# Patient Record
Sex: Male | Born: 1990 | Race: White | Hispanic: No | Marital: Single
Health system: Southern US, Community
[De-identification: ages and names within clinical notes are randomized; demographics above are authoritative.]

---
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
## 2018-02-18 ENCOUNTER — Emergency Department
Admission: EM | Admit: 2018-02-18 | Discharge: 2018-02-18 | Disposition: A | Discharge status: Home / Self Care | Payer: Self-pay | Attending: Emergency Medicine | Admitting: Emergency Medicine

## 2018-02-18 ENCOUNTER — Encounter: Payer: Self-pay | Admitting: Emergency Medicine

## 2018-02-18 ENCOUNTER — Emergency Department: Payer: Self-pay

## 2018-02-18 DIAGNOSIS — Y939 Activity, unspecified: Secondary | ICD-10-CM | POA: Insufficient documentation

## 2018-02-18 DIAGNOSIS — W01198A Fall on same level from slipping, tripping and stumbling with subsequent striking against other object, initial encounter: Secondary | ICD-10-CM | POA: Insufficient documentation

## 2018-02-18 DIAGNOSIS — S2242XA Multiple fractures of ribs, left side, initial encounter for closed fracture: Secondary | ICD-10-CM | POA: Insufficient documentation

## 2018-02-18 DIAGNOSIS — Y929 Unspecified place or not applicable: Secondary | ICD-10-CM | POA: Insufficient documentation

## 2018-02-18 DIAGNOSIS — Y999 Unspecified external cause status: Secondary | ICD-10-CM | POA: Insufficient documentation

## 2018-02-18 DIAGNOSIS — W19XXXA Unspecified fall, initial encounter: Secondary | ICD-10-CM

## 2018-02-18 LAB — CBC
HCT: 49.1 % (ref 40.0–52.0)
Hemoglobin: 17.5 g/dL (ref 13.0–18.0)
MCH: 37.2 pg — AB (ref 26.0–34.0)
MCHC: 35.7 g/dL (ref 32.0–36.0)
MCV: 104.3 fL — AB (ref 80.0–100.0)
PLATELETS: 343 10*3/uL (ref 150–440)
RBC: 4.71 MIL/uL (ref 4.40–5.90)
RDW: 13.9 % (ref 11.5–14.5)
WBC: 8.8 10*3/uL (ref 3.8–10.6)

## 2018-02-18 LAB — COMPREHENSIVE METABOLIC PANEL
ALK PHOS: 92 U/L (ref 38–126)
ALT: 77 U/L — AB (ref 0–44)
ANION GAP: 9 (ref 5–15)
AST: 58 U/L — ABNORMAL HIGH (ref 15–41)
Albumin: 4.4 g/dL (ref 3.5–5.0)
BUN: 15 mg/dL (ref 6–20)
CALCIUM: 9.1 mg/dL (ref 8.9–10.3)
CHLORIDE: 103 mmol/L (ref 98–111)
CO2: 26 mmol/L (ref 22–32)
CREATININE: 0.98 mg/dL (ref 0.61–1.24)
Glucose, Bld: 111 mg/dL — ABNORMAL HIGH (ref 70–99)
Potassium: 4.2 mmol/L (ref 3.5–5.1)
SODIUM: 138 mmol/L (ref 135–145)
Total Bilirubin: 0.7 mg/dL (ref 0.3–1.2)
Total Protein: 7.7 g/dL (ref 6.5–8.1)

## 2018-02-18 LAB — URINALYSIS, COMPLETE (UACMP) WITH MICROSCOPIC
BILIRUBIN URINE: NEGATIVE
Bacteria, UA: NONE SEEN
GLUCOSE, UA: NEGATIVE mg/dL
HGB URINE DIPSTICK: NEGATIVE
Ketones, ur: NEGATIVE mg/dL
LEUKOCYTES UA: NEGATIVE
NITRITE: NEGATIVE
Protein, ur: NEGATIVE mg/dL
SPECIFIC GRAVITY, URINE: 1.014 (ref 1.005–1.030)
Squamous Epithelial / LPF: NONE SEEN (ref 0–5)
pH: 7 (ref 5.0–8.0)

## 2018-02-18 MED ORDER — OXYCODONE-ACETAMINOPHEN 5-325 MG PO TABS: 1.0000 | ORAL_TABLET | Freq: Three times a day (TID) | ORAL | 0 refills | Status: AC | PRN

## 2018-02-18 MED ORDER — IBUPROFEN 800 MG PO TABS: 800.0000 mg | ORAL_TABLET | Freq: Three times a day (TID) | ORAL | 0 refills | Status: AC | PRN

## 2018-02-18 MED ORDER — OXYCODONE-ACETAMINOPHEN 5-325 MG PO TABS
2.0000 | ORAL_TABLET | Freq: Once | ORAL | Status: AC
  Administered 2018-02-18: 2 via ORAL
  Filled 2018-02-18: qty 2

## 2018-02-18 NOTE — ED Triage Notes (Signed)
Pt reports fell off the roof (8-10 ft) on Friday and is still having severe pain to his left side. Pt reports he landed on a piece of wood with his left side and the pain is over his left side. No bruises noted. Pt reports pain is severe and he states that he "feels like his insides are leaking".

## 2018-02-18 NOTE — ED Notes (Signed)
Pt given crackers.  

## 2018-02-18 NOTE — ED Notes (Signed)
Patient transported to X-ray 

## 2018-02-18 NOTE — ED Provider Notes (Signed)
[facility] Emergency Department Provider Note       Time seen: ----------------------------------------- 1:06 PM on 02/18/2018 -----------------------------------------   I have reviewed the triage vital signs and the nursing notes.  HISTORY   Chief Complaint Rib Injury and Fall    HPI Reginald Atkins is a 27 y.o. male with no significant past medical history who presents to the ED for severe left side pain after a fall 5 days ago.  Patient reports he landed on a piece of wood with his left side and the pain is over his left side.  No bruises were noted in triage.  Patient states the pain is severe.  He does admit to drinking at least a sixpack of beer daily.  History reviewed. No pertinent past medical history.  There are no active problems to display for this patient.   History reviewed. No pertinent surgical history.  Allergies Patient has no allergy information on record.  Social History Social History   Tobacco Use  . Smoking status: Not on file  Substance Use Topics  . Alcohol use: Not on file  . Drug use: Not on file   Review of Systems Constitutional: Negative for fever. Cardiovascular: Negative for chest pain. Respiratory: Negative for shortness of breath. Gastrointestinal: Positive for left flank pain Musculoskeletal: Negative for back pain. Skin: Negative for rash. Neurological: Negative for headaches, focal weakness or numbness.  All systems negative/normal/unremarkable except as stated in the HPI  ____________________________________________   PHYSICAL EXAM:  VITAL SIGNS: ED Triage Vitals  Enc Vitals Group     BP 02/18/18 1045 (!) 167/111     Pulse Rate 02/18/18 1045 84     Resp 02/18/18 1045 20     Temp 02/18/18 1045 98 F (36.7 C)     Temp Source 02/18/18 1045 Oral     SpO2 02/18/18 1045 99 %     Weight 02/18/18 1046 165 lb (74.8 kg)     Height 02/18/18 1046 6' (1.829 m)     Head Circumference --      Peak  Flow --      Pain Score 02/18/18 1046 9     Pain Loc --      Pain Edu? --      Excl. in GC? --    Constitutional: Alert and oriented. Well appearing and in no distress. Eyes: Conjunctivae are normal. Normal extraocular movements. ENT   Head: Normocephalic and atraumatic.   Nose: No congestion/rhinnorhea.   Mouth/Throat: Mucous membranes are moist.   Neck: No stridor. Cardiovascular: Normal rate, regular rhythm. No murmurs, rubs, or gallops. Respiratory: Normal respiratory effort without tachypnea nor retractions. Breath sounds are clear and equal bilaterally. No wheezes/rales/rhonchi. Gastrointestinal: Soft and nontender. Normal bowel sounds Musculoskeletal: Nontender with normal range of motion in extremities.  Left inferior and axillary rib tenderness Neurologic:  Normal speech and language. No gross focal neurologic deficits are appreciated.  Skin:  Skin is warm, dry and intact. No rash noted. Psychiatric: Mood and affect are normal. Speech and behavior are normal.  ____________________________________________  ED COURSE:  As part of my medical decision making, I reviewed the following data within the electronic MEDICAL RECORD NUMBER History obtained from family if available, nursing notes, old chart and ekg, as well as notes from prior ED visits. Patient presented for a recent fall, we will assess with labs and imaging as indicated at this time.   Procedures ____________________________________________   LABS (pertinent positives/negatives)  Labs Reviewed  CBC - Abnormal;  Notable for the following components:      Result Value   MCV 104.3 (*)    MCH 37.2 (*)    All other components within normal limits  COMPREHENSIVE METABOLIC PANEL - Abnormal; Notable for the following components:   Glucose, Bld 111 (*)    AST 58 (*)    ALT 77 (*)    All other components within normal limits  URINALYSIS, COMPLETE (UACMP) WITH MICROSCOPIC - Abnormal; Notable for the following  components:   Color, Urine YELLOW (*)    APPearance CLEAR (*)    All other components within normal limits    RADIOLOGY Images were viewed by me Rib x-rays IMPRESSION: 1.  No acute cardiopulmonary disease.  No pneumothorax.  2. Left anterolateral seventh and left posterior-lateral tenth rib fractures cannot be excluded.   ____________________________________________  DIFFERENTIAL DIAGNOSIS   Contusion, rib fracture, intra-abdominal injury unlikely  FINAL ASSESSMENT AND PLAN  Rib fractures, fall   Plan: The patient had presented for a fall 5 days ago. Patient's labs did not reveal any acute process but did reveal significant alcohol use disorder. Patient's imaging did reveal to rib fractures on the left side.  He be discharged with pain medicine, anti-inflammatory and is cleared for outpatient follow-up.   Ulice Dash, MD   Note: This note was generated in part or whole with voice recognition software. Voice recognition is usually quite accurate but there are transcription errors that can and very often do occur. I apologize for any typographical errors that were not detected and corrected.     Emily Filbert, MD 02/18/18 1308
